# Patient Record
Sex: Male | Born: 1980 | Race: Black or African American | Hispanic: No | Marital: Single
Health system: Southern US, Community
[De-identification: ages and names within clinical notes are randomized; demographics above are authoritative.]

---

## 2009-01-17 ENCOUNTER — Emergency Department: Payer: Self-pay | Admitting: Emergency Medicine

## 2009-02-22 ENCOUNTER — Ambulatory Visit: Payer: Self-pay | Admitting: Internal Medicine

## 2009-10-03 IMAGING — CR RIGHT ANKLE - COMPLETE 3+ VIEW
1 series · 5 of 5 positions shown · non-contrast
Comparison: none

REASON FOR EXAM: injury, pain secondary to twisting
COMMENTS:

PROCEDURE:     DXR - DXR ANKLE RIGHT COMPLETE  - January 18, 2009 [DATE]
RESULT:     No fracture, dislocation or other acute bony abnormality is
identified. The ankle mortise is well-maintained.

[Series 1: view not recorded · 0.17mm/px · 5 of 5 slices shown]
[im 1/5]
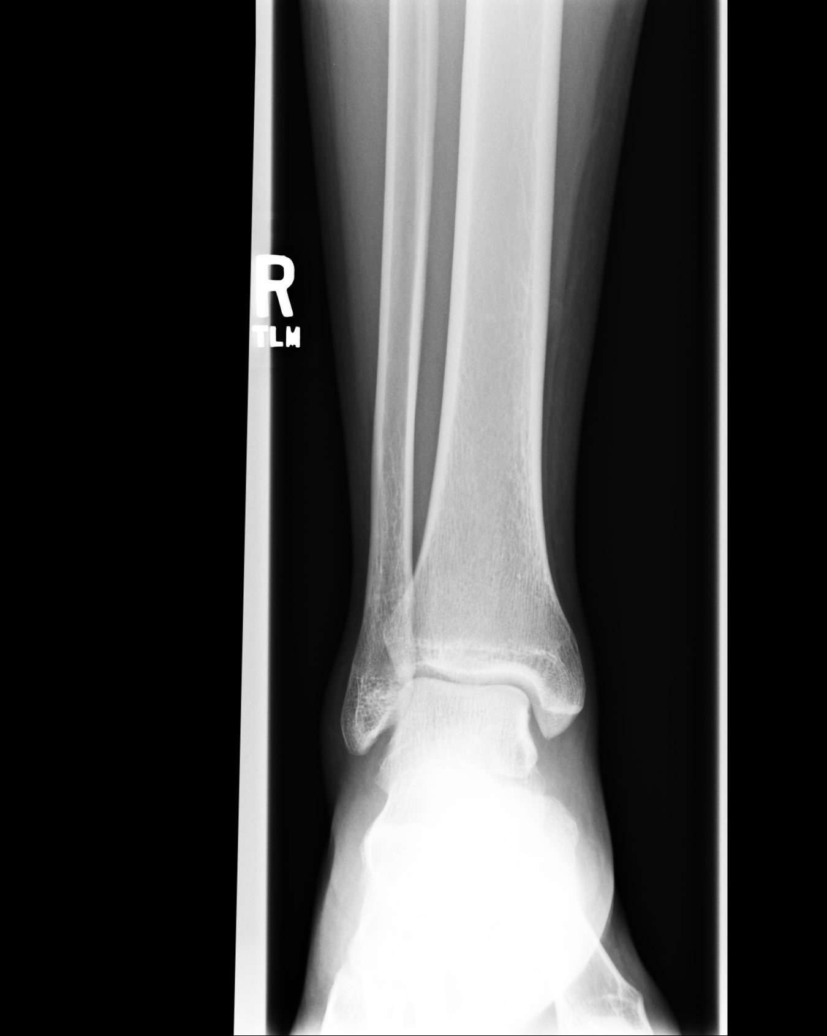
[im 2/5]
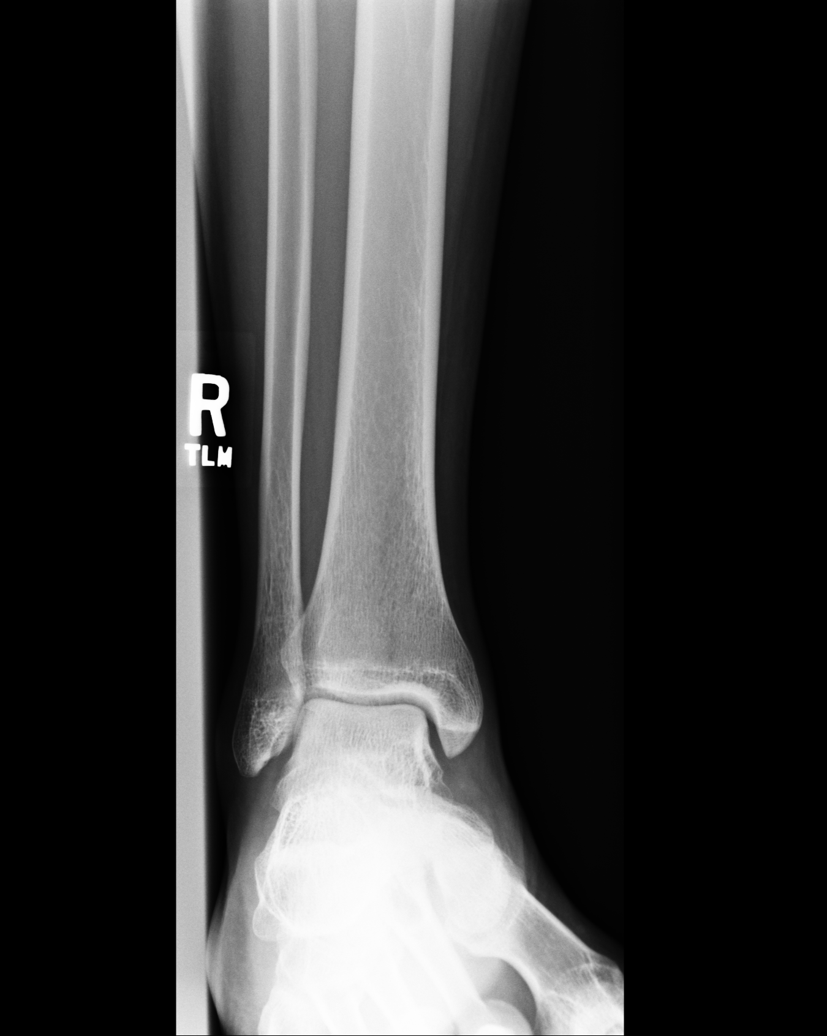
[im 3/5]
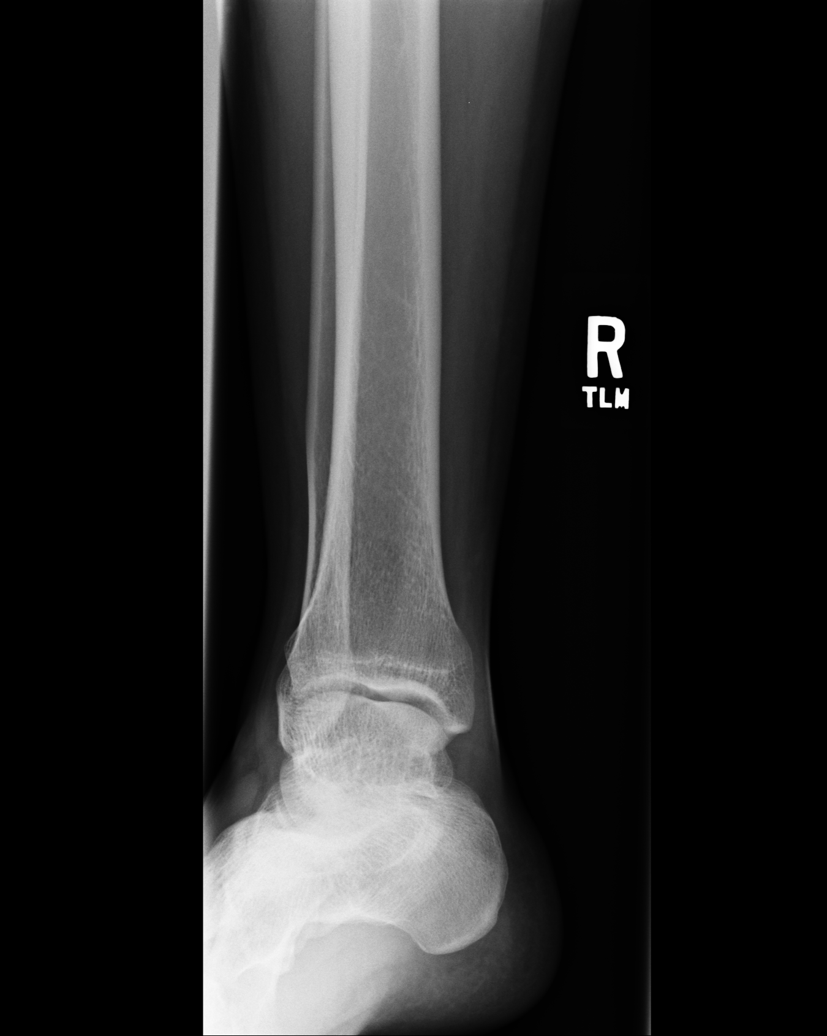
[im 4/5]
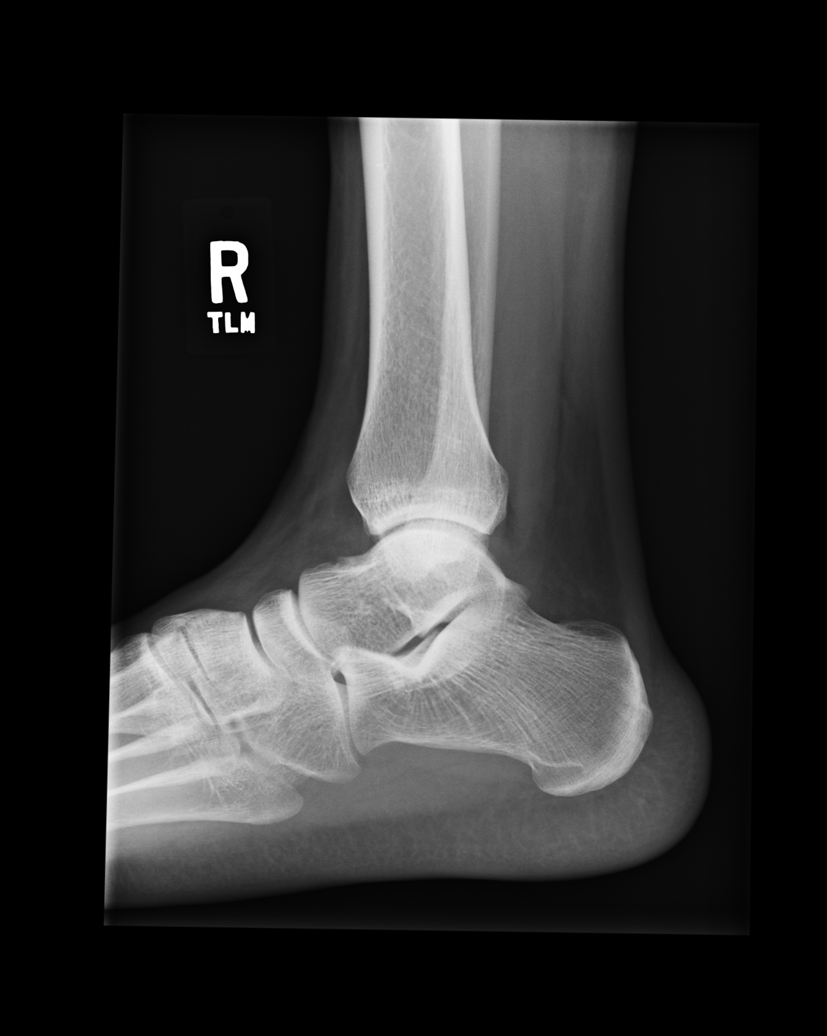
[im 5/5]
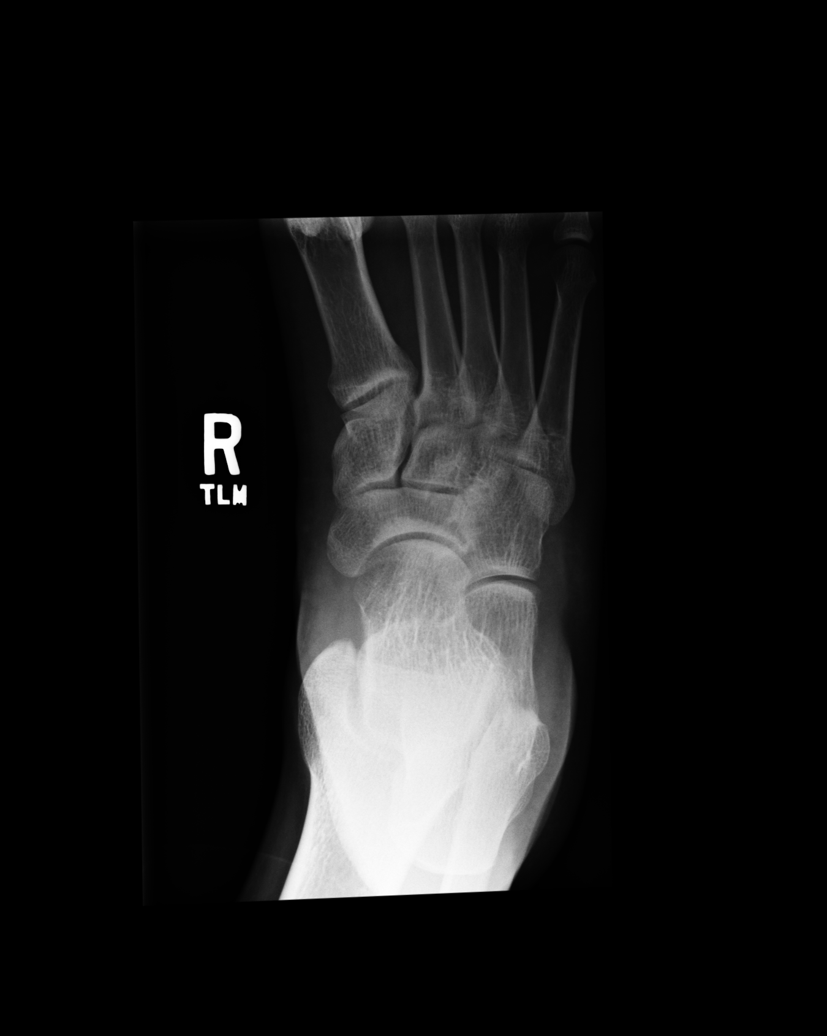

[5 of 5 positions shown; findings below may reference images not displayed]

IMPRESSION: No acute changes are identified.

## 2013-06-27 ENCOUNTER — Emergency Department: Payer: Self-pay | Admitting: Emergency Medicine

## 2013-07-01 ENCOUNTER — Ambulatory Visit: Payer: Self-pay | Admitting: Orthopedic Surgery

## 2013-12-05 ENCOUNTER — Emergency Department: Payer: Self-pay | Admitting: Emergency Medicine

## 2013-12-05 LAB — ETHANOL
ETHANOL %: 0.058 % (ref 0.000–0.080)
Ethanol: 58 mg/dL

## 2013-12-05 LAB — COMPREHENSIVE METABOLIC PANEL WITH GFR
Albumin: 3.7 g/dL
Alkaline Phosphatase: 64 U/L
Anion Gap: 5 — ABNORMAL LOW
BUN: 7 mg/dL
Bilirubin,Total: 0.3 mg/dL
Calcium, Total: 8.7 mg/dL
Chloride: 102 mmol/L
Co2: 28 mmol/L
Creatinine: 1.48 mg/dL — ABNORMAL HIGH
EGFR (African American): >60
EGFR (Non-African Amer.): >60
Glucose: 132 mg/dL — ABNORMAL HIGH
Osmolality: 270
Potassium: 3.1 mmol/L — ABNORMAL LOW
SGOT(AST): 39 U/L — ABNORMAL HIGH
SGPT (ALT): 36 U/L
Sodium: 135 mmol/L — ABNORMAL LOW
Total Protein: 7.5 g/dL

## 2013-12-05 LAB — CBC
HCT: 41.8 %
HGB: 14 g/dL
MCH: 31.4 pg
MCHC: 33.6 g/dL
MCV: 93 fL
Platelet: 280 x10 3/mm 3
RBC: 4.47 x10 6/mm 3
RDW: 13.5 %
WBC: 12.8 x10 3/mm 3 — ABNORMAL HIGH

## 2013-12-05 LAB — TROPONIN I

## 2013-12-05 LAB — CK TOTAL AND CKMB (NOT AT ARMC)
CK, Total: 209 U/L
CK-MB: 0.8 ng/mL

## 2014-02-14 ENCOUNTER — Emergency Department: Payer: Self-pay | Admitting: Emergency Medicine

## 2014-03-13 IMAGING — CR RIGHT HAND - COMPLETE 3+ VIEW
1 series · 3 of 3 positions shown · non-contrast
Comparison: none

REASON FOR EXAM: injury, swelling
COMMENTS:

PROCEDURE:     DXR - DXR HAND RT COMPLETE W/OBLIQUES  - June 27, 2013 [DATE]
RESULT:     Comparison:  None

[Series 1: pa · 0.17mm/px · 3 of 3 slices shown]
[im 1/3]
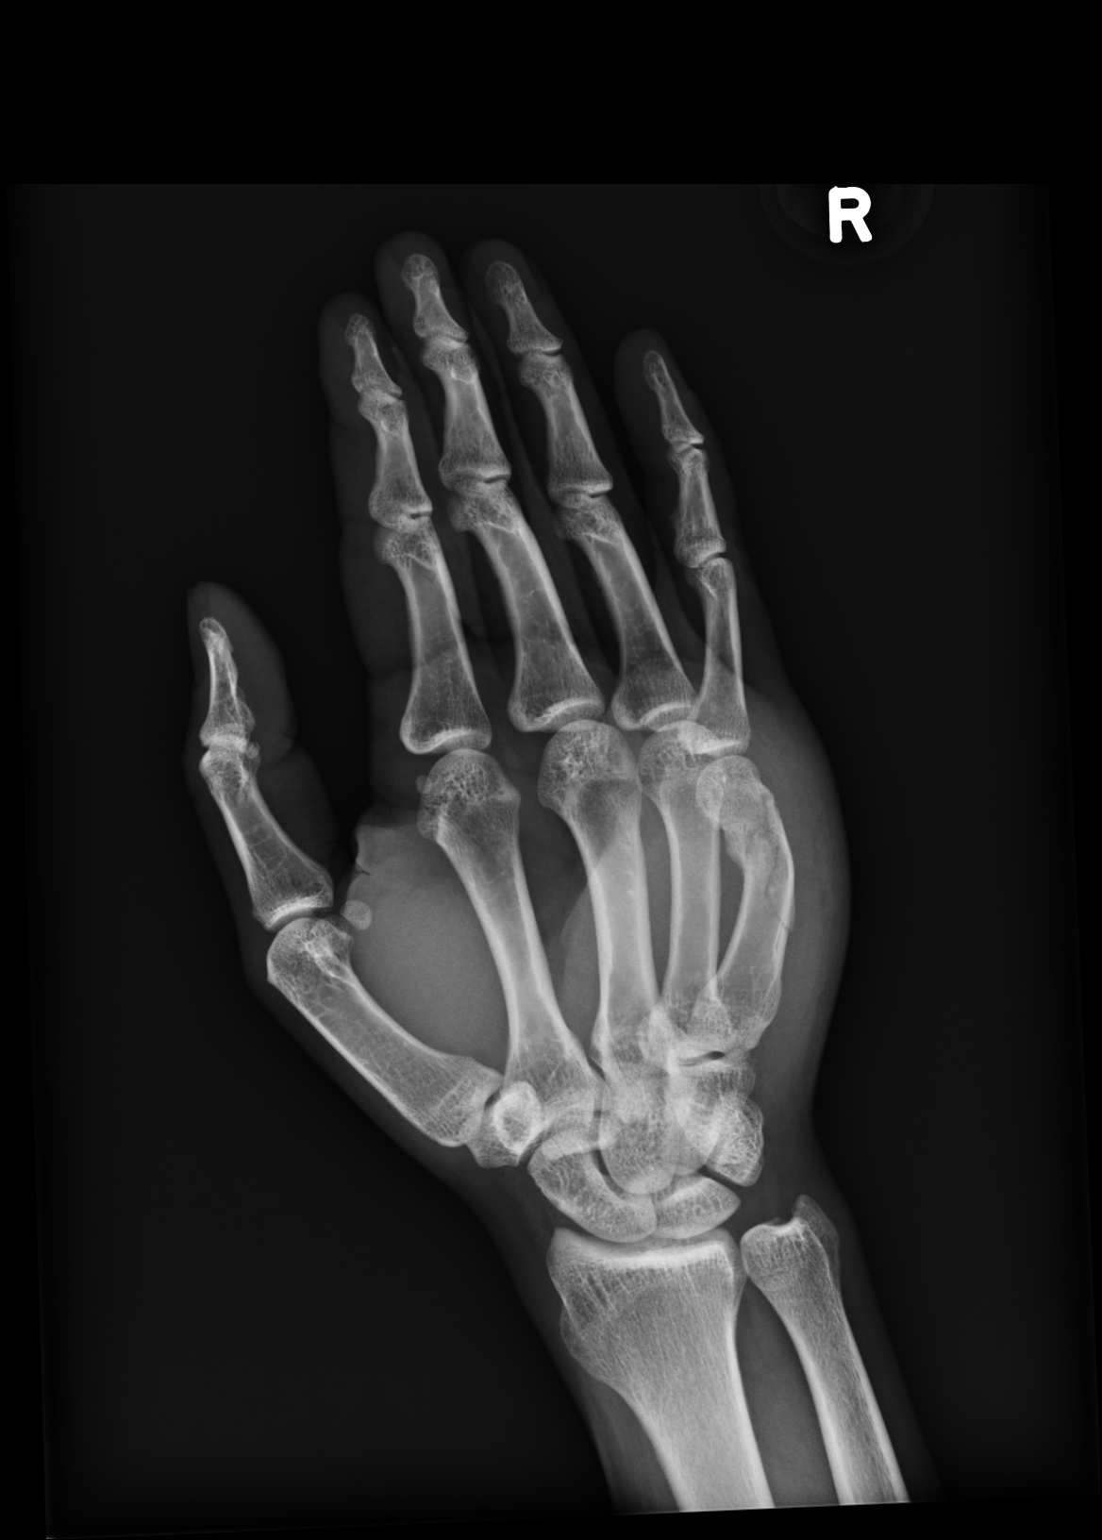
[im 2/3]
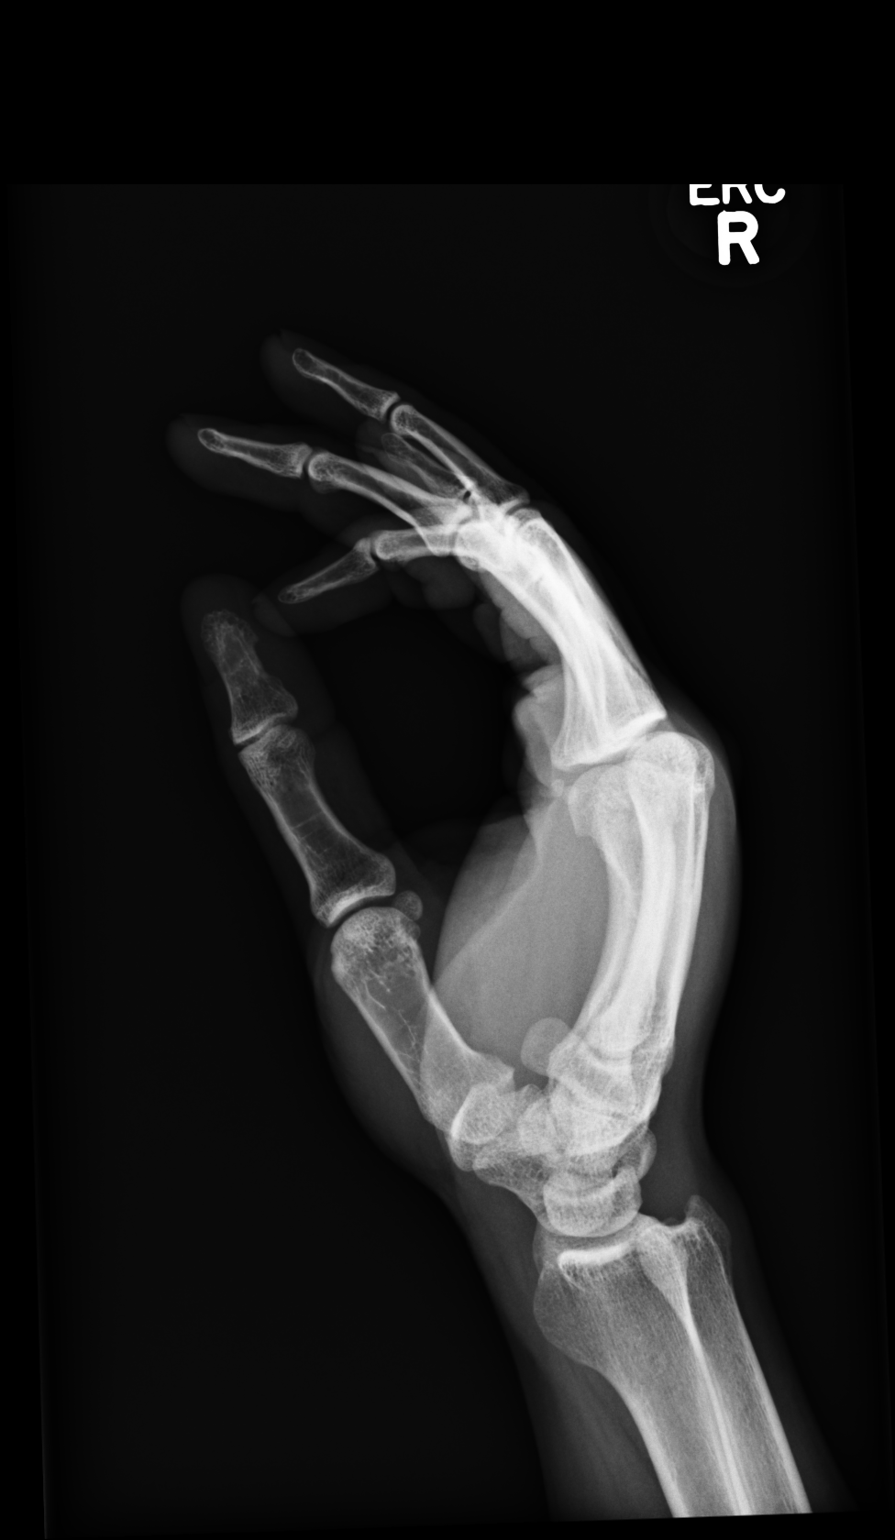
[im 3/3]
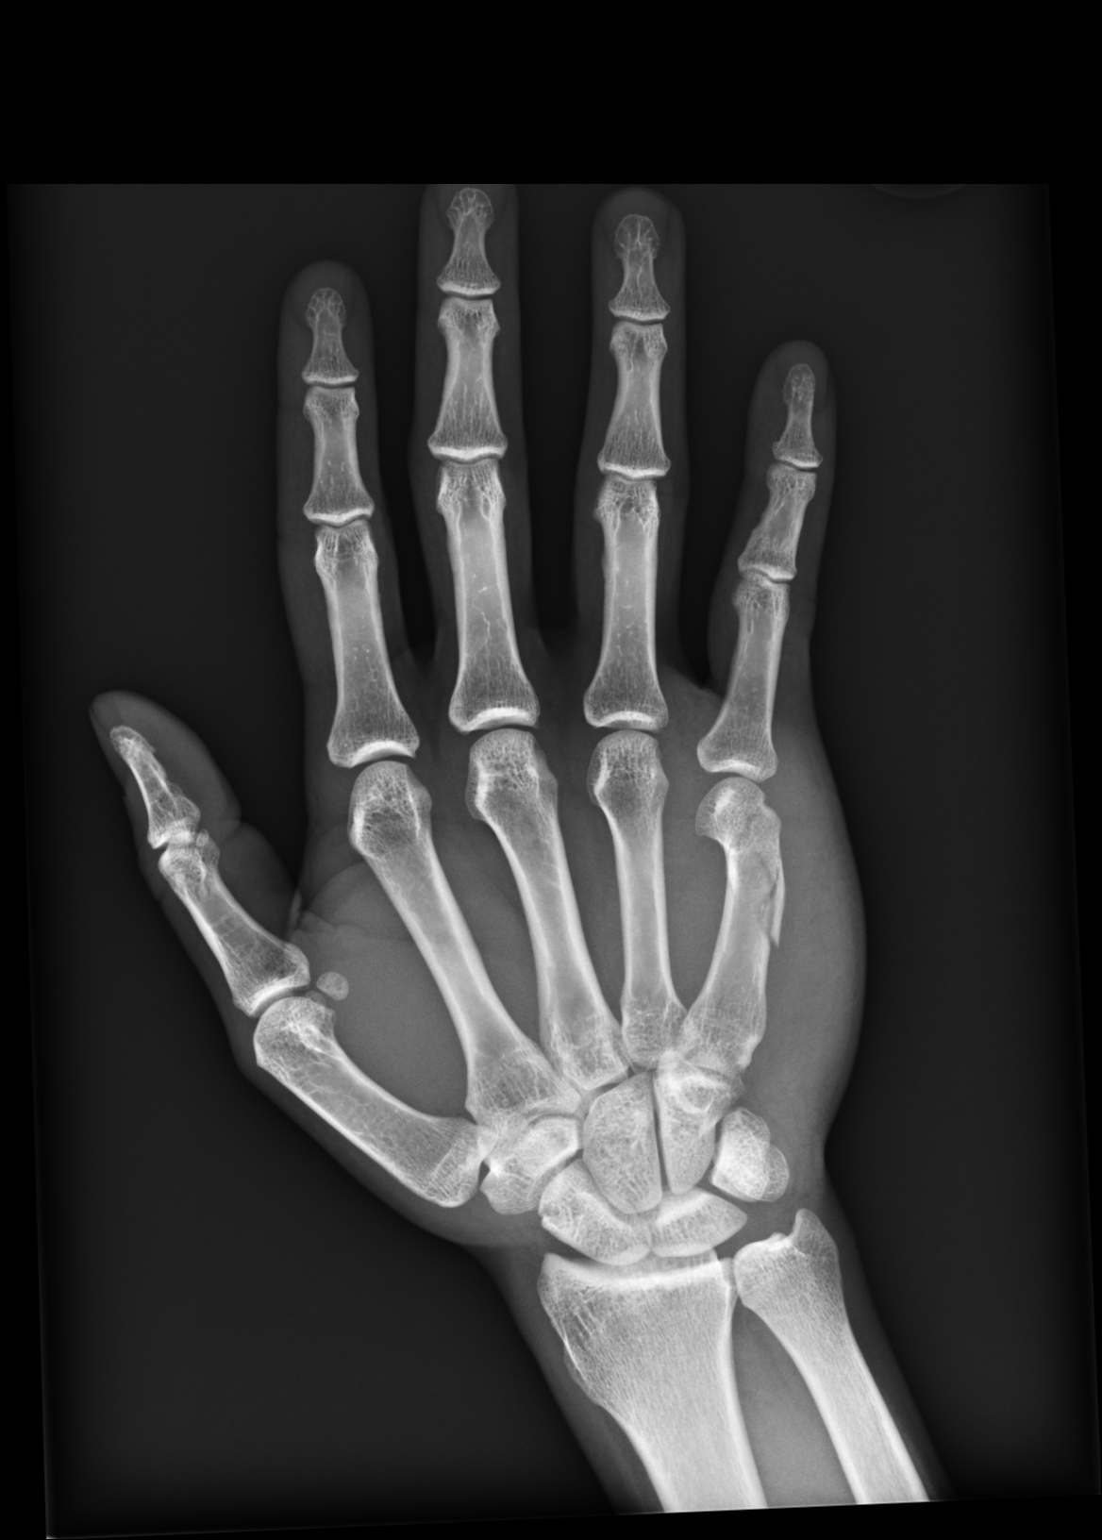

[3 of 3 positions shown; findings below may reference images not displayed]

FINDINGS: AP, oblique, and lateral views of the right hand demonstrates a comminuted
fracture of the distal fifth metacarpal with overlying soft tissue swelling
and mild apex dorsal angulation. There is dislocation. There is normal bone
mineralization. There are no erosive changes. The joint spaces are
maintained.
IMPRESSION: Please see above.

[REDACTED]

## 2014-03-17 IMAGING — CR RIGHT HAND - 2 VIEW
1 series · 2 of 2 positions shown · non-contrast
Comparison: none

REASON FOR EXAM: post op
COMMENTS:   Bedside (portable):Y

[Series 1: pa · 0.17mm/px · 2 of 2 slices shown]
[im 1/2]
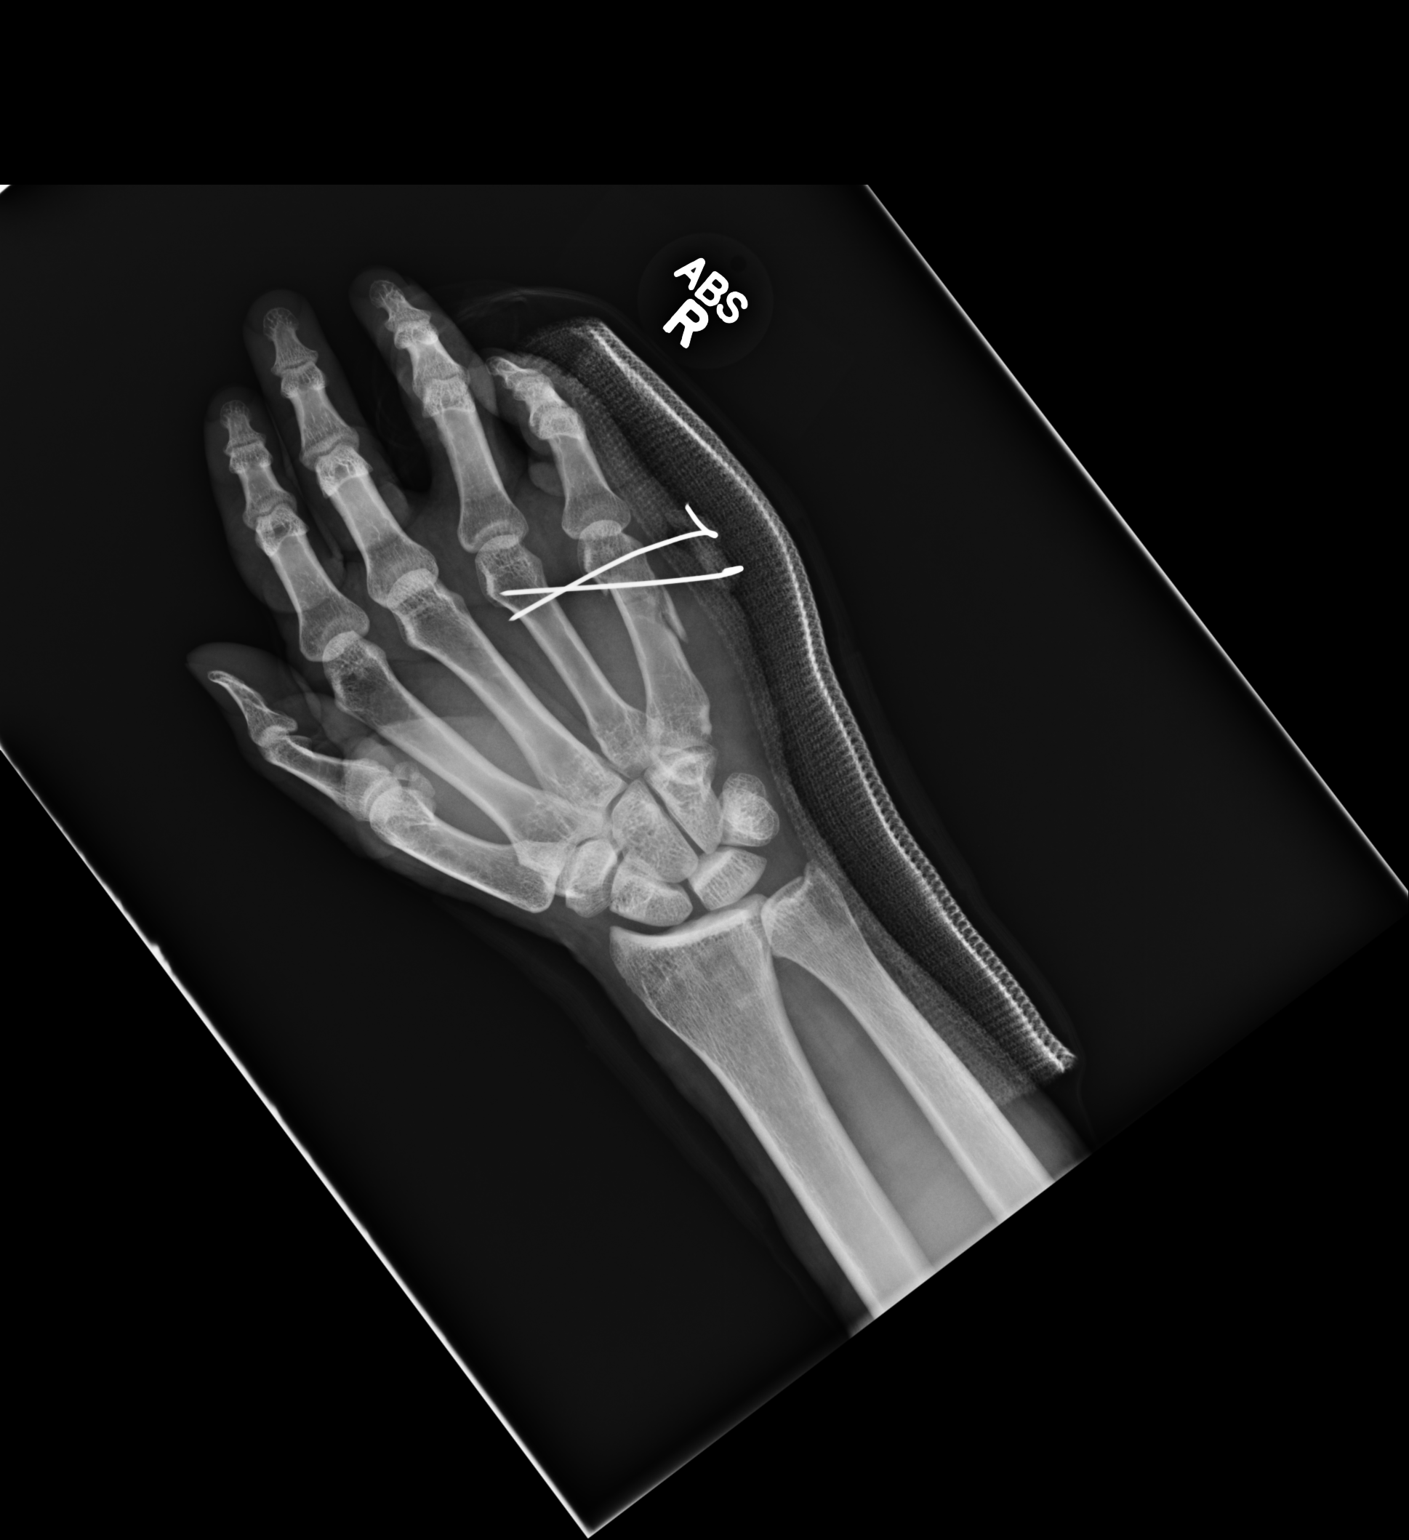
[im 2/2]
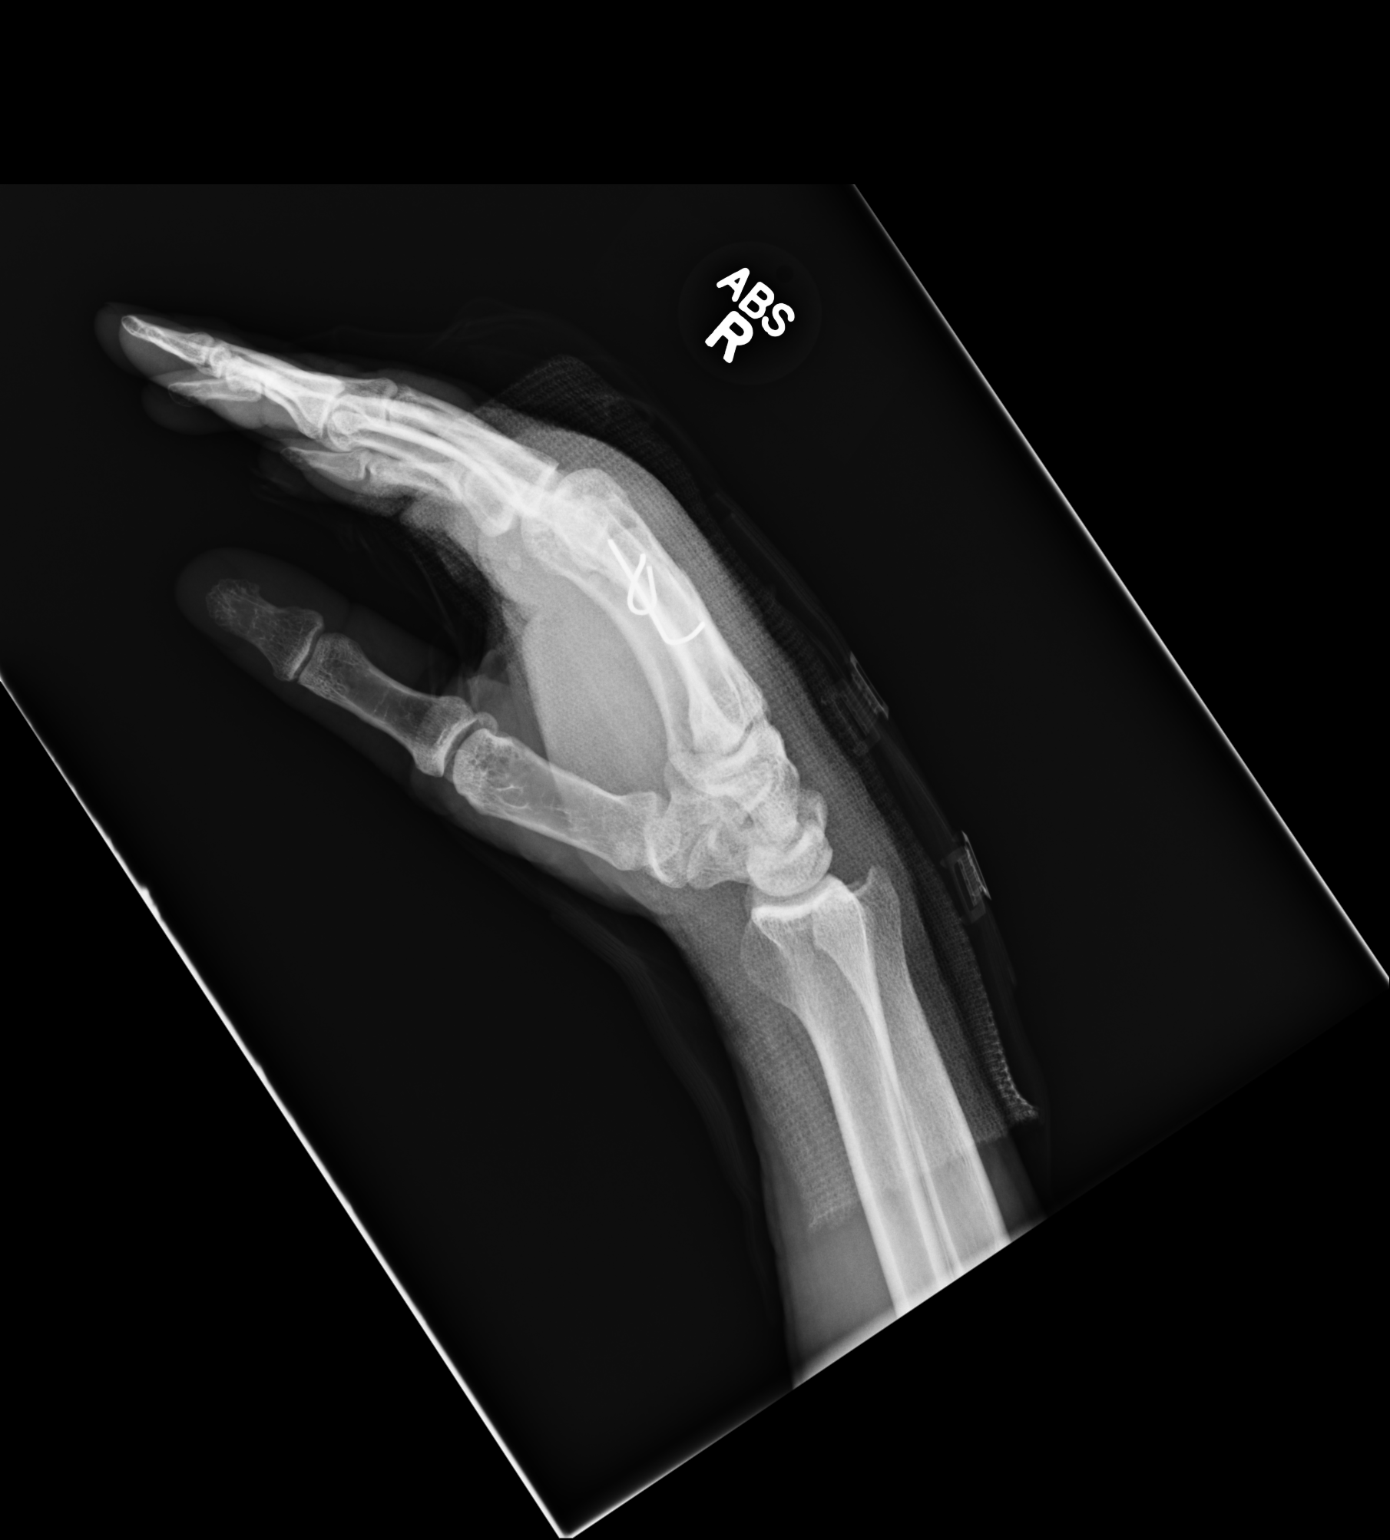

[2 of 2 positions shown; findings below may reference images not displayed]

PROCEDURE:     DXR - DXR HAND RT TWO VIEWS  - July 01, 2013  [DATE]

RESULT:     Two films obtained in plaster reveal the patient to have
undergone pinning of the fracture of the distal aspect of the fifth
metacarpal. The pins traverse the fifth metacarpal and entere the distal
aspect of the fourth metacarpal.
IMPRESSION: The patient has undergone pinning for the comminuted
fracture of the distal aspect of the right fifth metacarpal. Further
interpretation is deferred to Dr. Lap Man.

[REDACTED]

## 2014-08-21 IMAGING — CT CT MAXILLOFACIAL WITHOUT CONTRAST
4 of 8 series · 14 of 47 positions shown, 16 images · non-contrast
Comparison: None available for comparison at time of study
interpretation.

CLINICAL DATA: Syncope, fall, chin laceration.

EXAM:
CT HEAD WITHOUT CONTRAST
CT MAXILLOFACIAL WITHOUT CONTRAST
CT CERVICAL SPINE WITHOUT CONTRAST
TECHNIQUE: Multidetector CT imaging of the head, cervical spine, and
maxillofacial structures were performed using the standard protocol
without intravenous contrast. Multiplanar CT image reconstructions
of the cervical spine and maxillofacial structures were also
generated.

[Series 11: soft tissue · axial · 0.34mm/px · z∈[-316,-266]mm · 3 of 100 slices shown]
[im 13/100  brain]
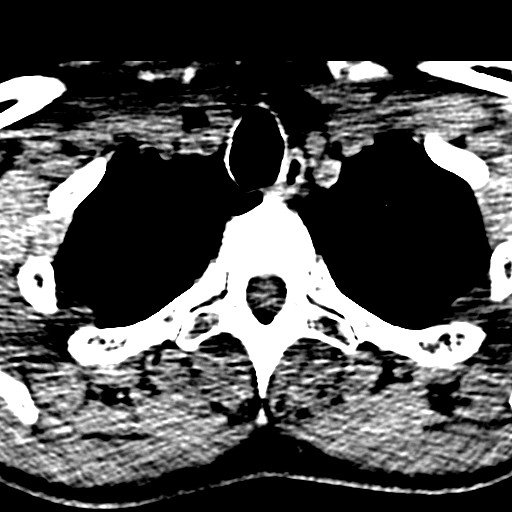
[im 25/100  brain]
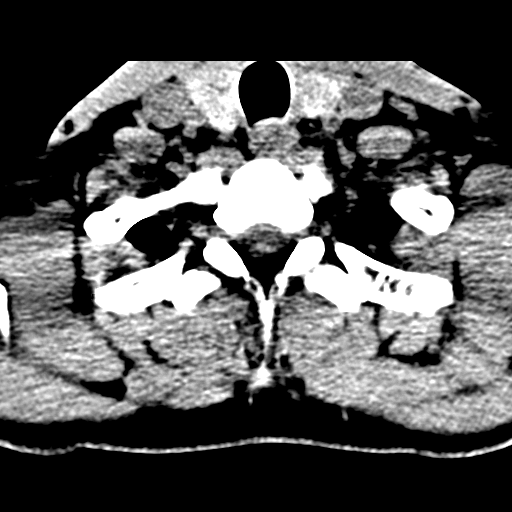
[im 38/100  brain]
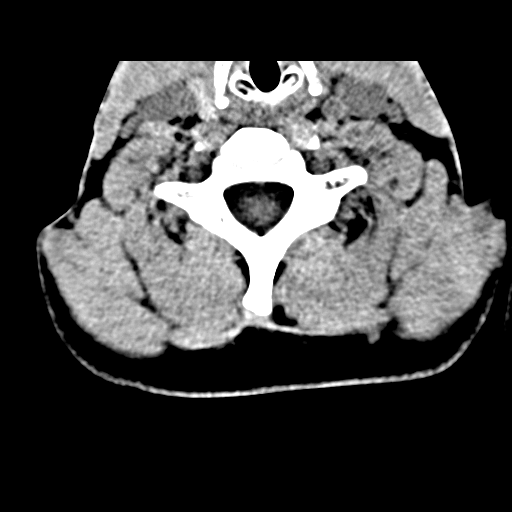

[Series 14: sagittal bone · sagittal · 0.21mm/px · 1 of 49 slices shown]
[im 25/49  bone]
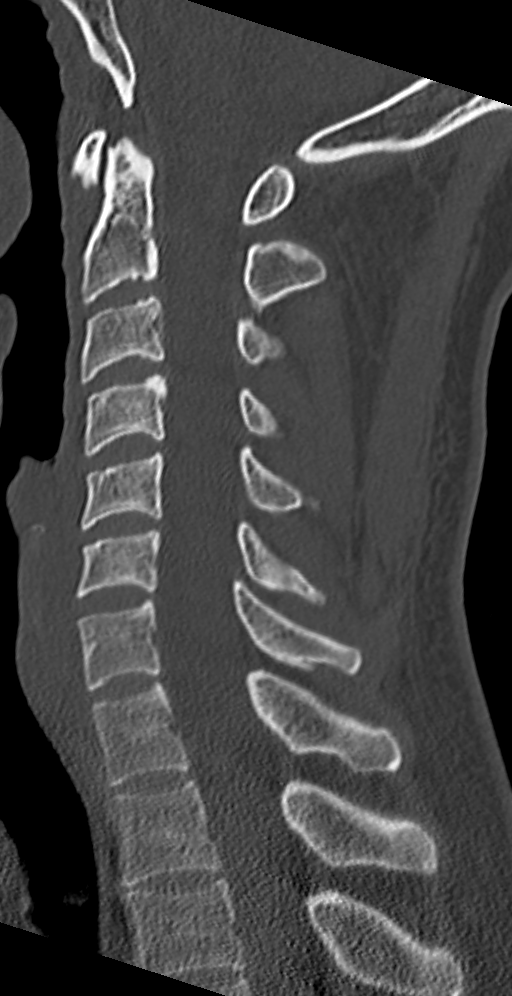

[Series 15: coronal bone · coronal · 0.24mm/px · 2 of 51 slices shown]
[im 17/51  bone]
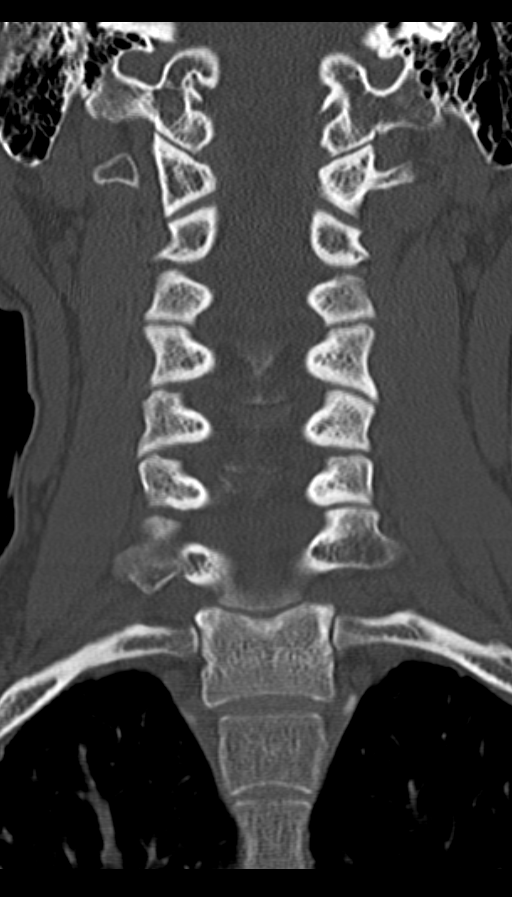
[im 34/51  bone]
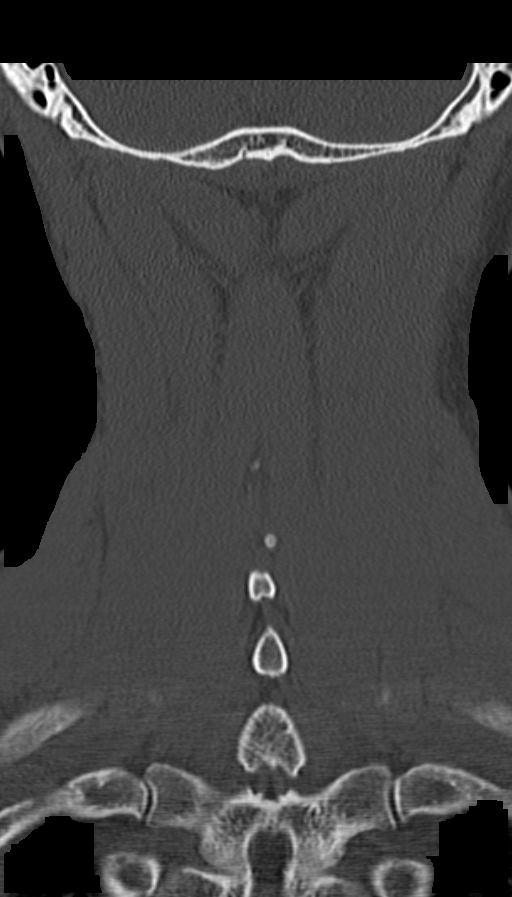

[Series 16: axial · axial · 0.19mm/px · z∈[-334,-176]mm · 8 of 109 slices shown, 10 images]
[im 13/109  brain]
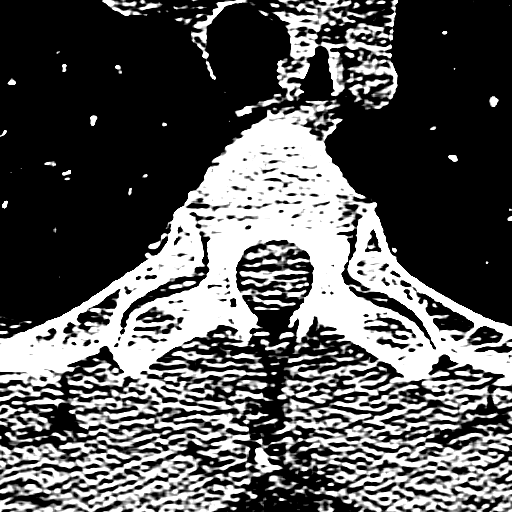
[im 13/109  bone]
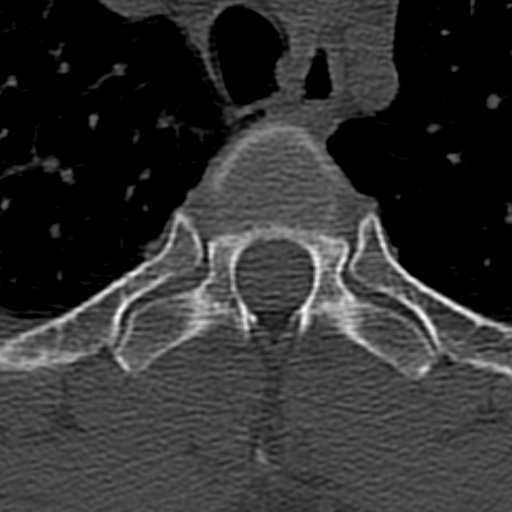
[im 25/109  bone]
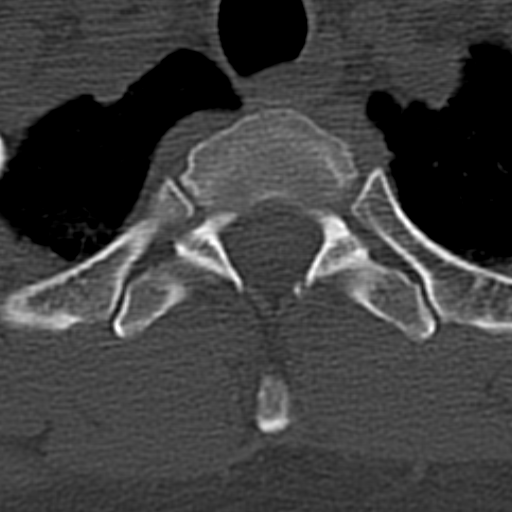
[im 37/109  bone]
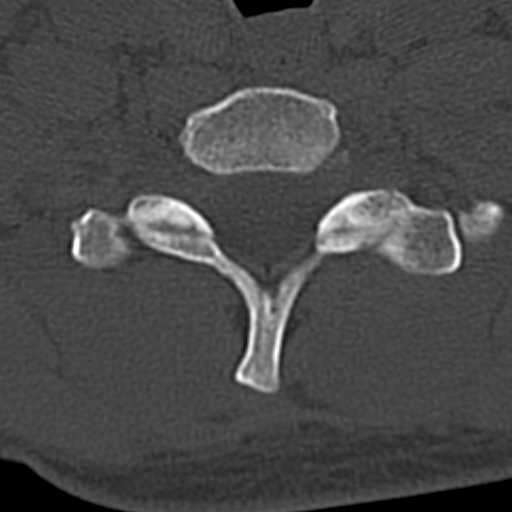
[im 49/109  bone]
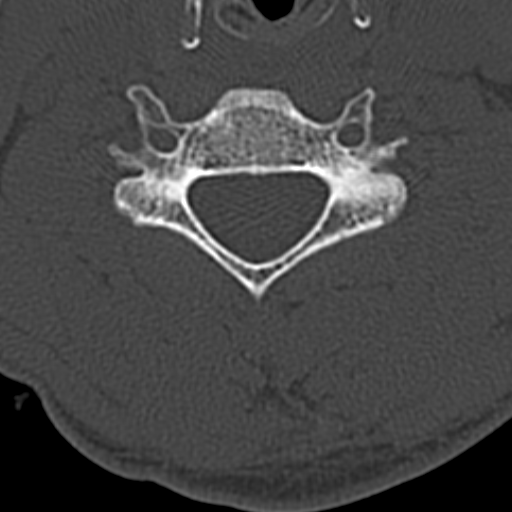
[im 61/109  brain]
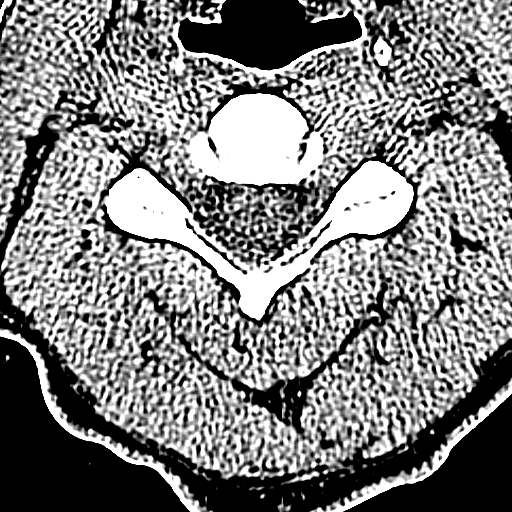
[im 61/109  bone]
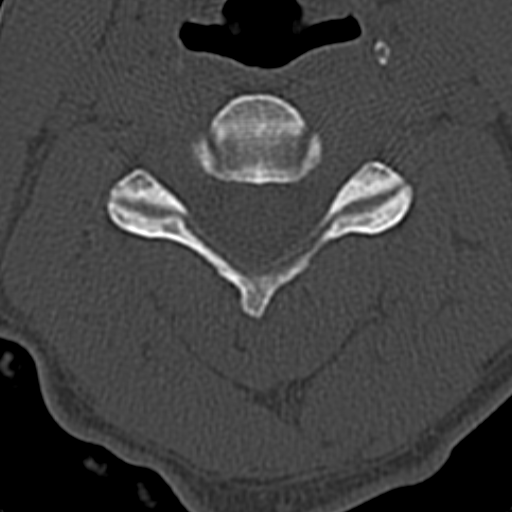
[im 73/109  bone]
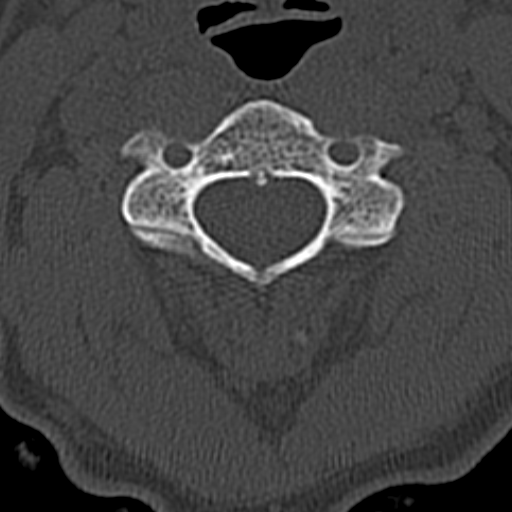
[im 85/109  bone]
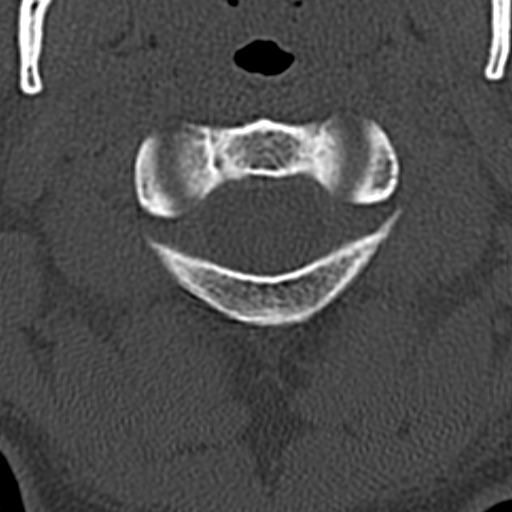
[im 97/109  bone]
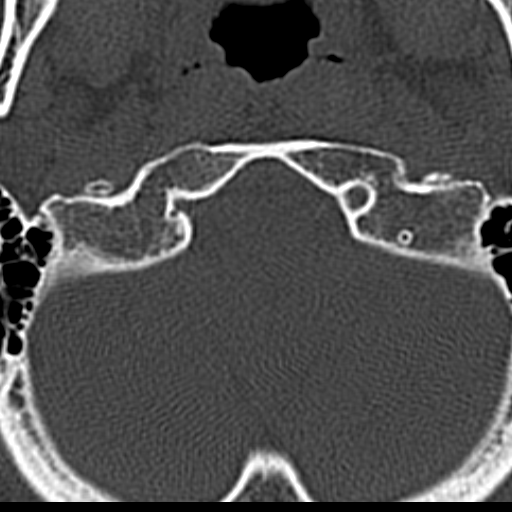

[14 of 47 positions shown; findings below may reference images not displayed]

FINDINGS: CT HEAD FINDINGS

The ventricles and sulci are normal. No intraparenchymal hemorrhage,
mass effect nor midline shift. No acute large vascular territory
infarcts.

No abnormal extra-axial fluid collections. Basal cisterns are
patent.

No skull fracture. The included ocular globes and orbital contents
are non-suspicious.

CT MAXILLOFACIAL FINDINGS

Minimally displaced fracture of the posterior wall of the left
glenoid fossa 0 with bony fragments narrowing the external auditory
canal, axial 32/84. However, the mandible is intact and the condyles
are located. Middle ears appear well aerated. The nasal septum is
slightly deviated to the right, right concha bullosa. Minimal left
maxillary mucosal retention cyst without paranasal sinus air-fluid
levels. No destructive bony lesions.

Minimal air within the left lateral pharyngeal space.

CT CERVICAL SPINE FINDINGS

Cervical vertebral bodies and posterior elements are intact and
aligned with straightened cervical lordosis. Intervertebral disc
heights preserved. No destructive bony lesions. C1-2 articulation
maintained. Included prevertebral and paraspinal soft tissues are
nonsuspicious. Paraseptal emphysema in the included view of the lung
apices. .
IMPRESSION: CT head:  No acute intracranial process.

Maxillofacial CT: Minimally depressed left posterior wall of the
glenoid fossa (temporal bone) fracture with fragments displaced into
the external auditory canal. Mandible condyles are located.

CT cervical spine: Straightened cervical lordosis without acute
fracture nor malalignment.

  By: Lucina Reimers

## 2015-02-24 NOTE — Op Note (Signed)
PATIENT NAME:  Anthony Sims, Duke D MR#:  [id number] DATE OF BIRTH:  Nov 28, 1980  DATE OF PROCEDURE:  07/01/2013  PREOPERATIVE DIAGNOSIS: Displaced right fifth metacarpal fracture.   PROCEDURE: Closed reduction and pinning, right fifth metacarpal.   ANESTHESIA: General.   SURGEON: Leitha SchullerMichael J. Jamilynn Whitacre, M.D.   DESCRIPTION OF PROCEDURE: The patient was brought to the operating room and after adequate anesthesia was obtained, the right arm was prepped and draped in the usual sterile fashion with a tourniquet applied to the upper arm. After appropriate patient identification and timeout procedures were completed, closed reduction was carried out with dorsal pressure at the apex of the mid metacarpal. Rotational correction was also carried out. C-arm was used, and with essentially anatomic alignment obtained,  two K wires were inserted through the lateral border of the hand through the fifth metacarpal into the fourth. The first one straight from one neck to the other, the second obliquely. This gave what appeared to be essentially anatomic alignment in all planes. There is no rotational deformity. The pins were cut short and bent over. Xeroform placed around the base of the pins along 4 x 4's after infiltration of 10 mL 0.5% Sensorcaine to aid in postoperative analgesia. Webril and ulnar gutter splint were applied followed by an Ace wrap. The patient was sent to the recovery room in stable condition.   ESTIMATED BLOOD LOSS: Minimal.   COMPLICATIONS: None.   SPECIMEN: None.   IMPLANTS: K wire x 2.      ____________________________ Leitha SchullerMichael J. Swain Acree, MD mjm:cc D: 07/01/2013 16:10:23 ET T: 07/01/2013 21:10:18 ET JOB#: [id number]  cc: Leitha SchullerMichael J. Xzavien Harada, MD, <Dictator> Leitha SchullerMICHAEL J Clayburn Weekly MD ELECTRONICALLY SIGNED 07/04/2013 18:34
